# Patient Record
Sex: Female | Born: 1985 | Race: White | Hispanic: No | Marital: Single | Smoking: Current every day smoker
Health system: Southern US, Community
[De-identification: ages and names within clinical notes are randomized; demographics above are authoritative.]

---
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
## 2007-01-05 DIAGNOSIS — S92919A Unspecified fracture of unspecified toe(s), initial encounter for closed fracture: Secondary | ICD-10-CM | POA: Insufficient documentation

## 2007-05-13 IMAGING — CR DG FOOT COMPLETE 3+V*R*
3 series · 3 of 3 positions shown · non-contrast
Comparison: none

CLINICAL DATA: Right ankle swelling and discoloration.  Pain lateral foot.  Twisting injury.
 RIGHT ANKLE ? 3 VIEW:

[view not recorded (1 of 3)]
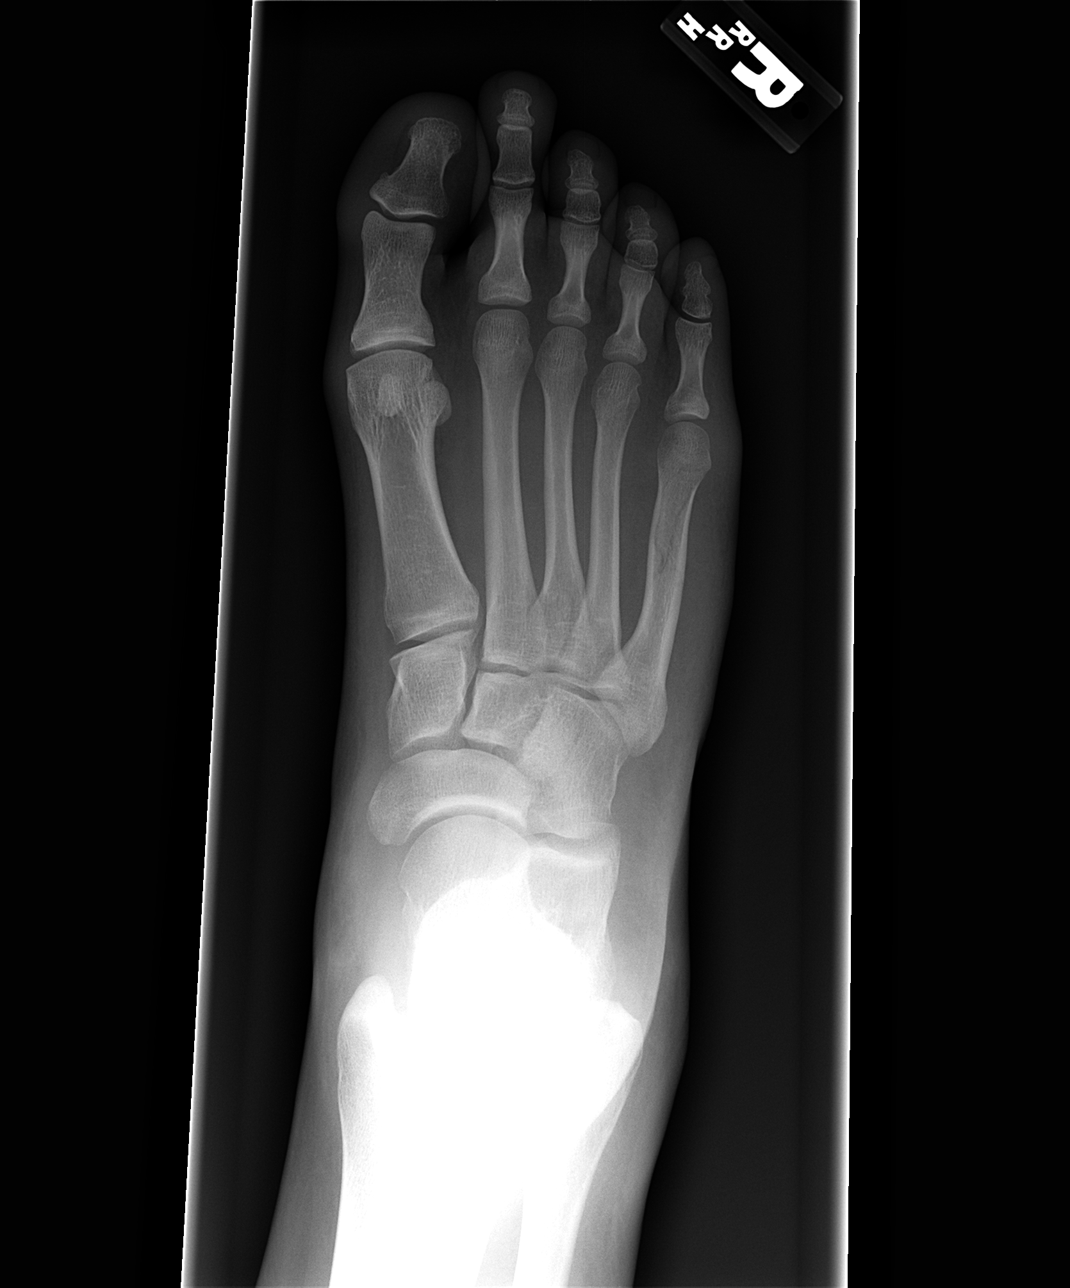

[view not recorded (2 of 3)]
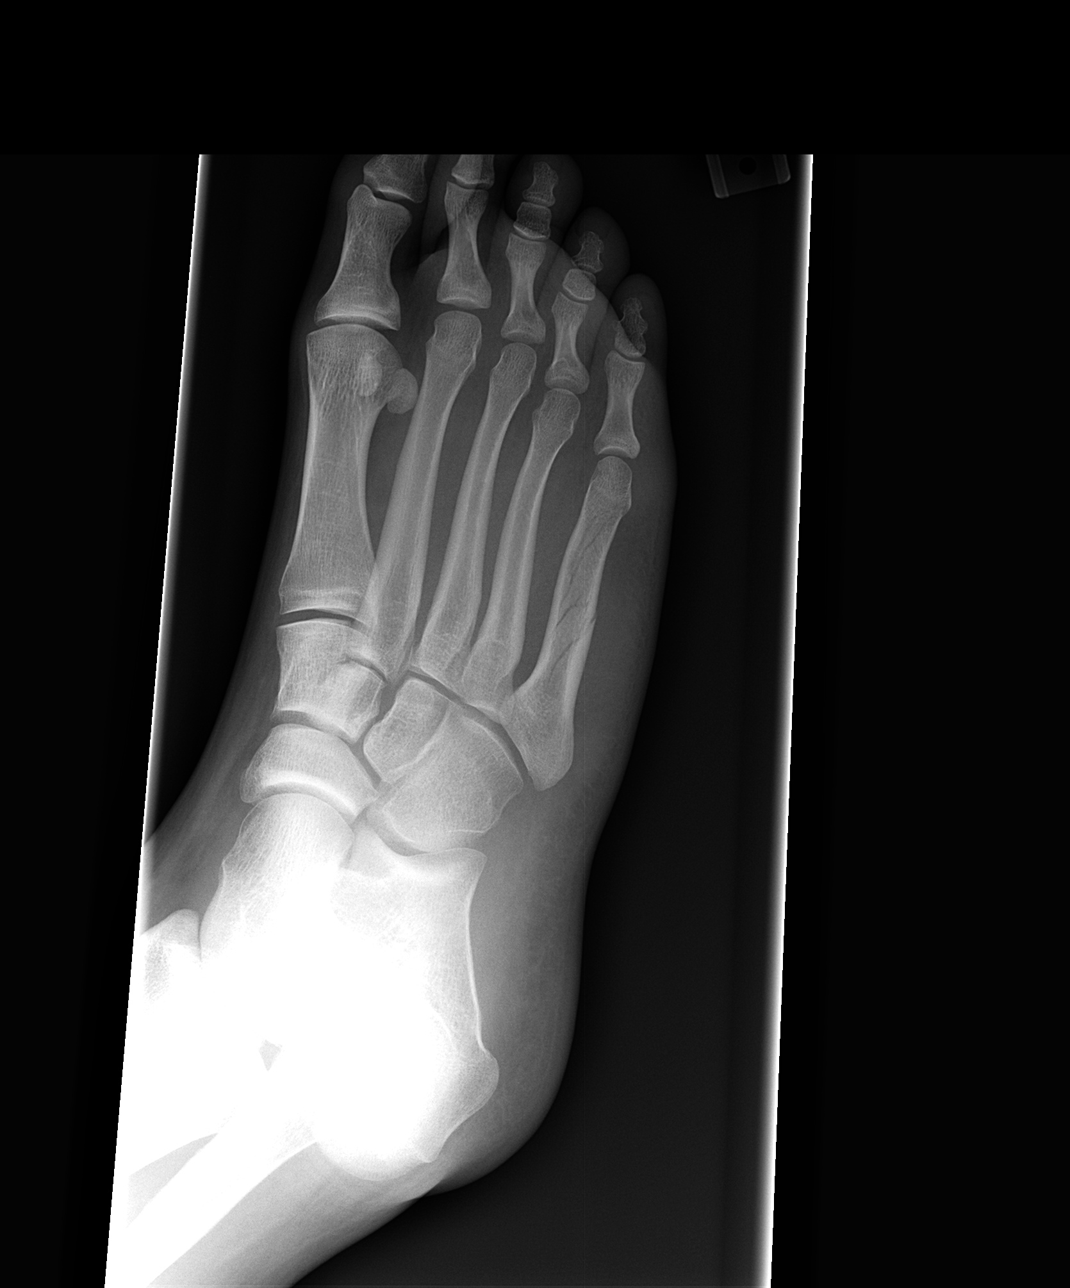

[view not recorded (3 of 3)]
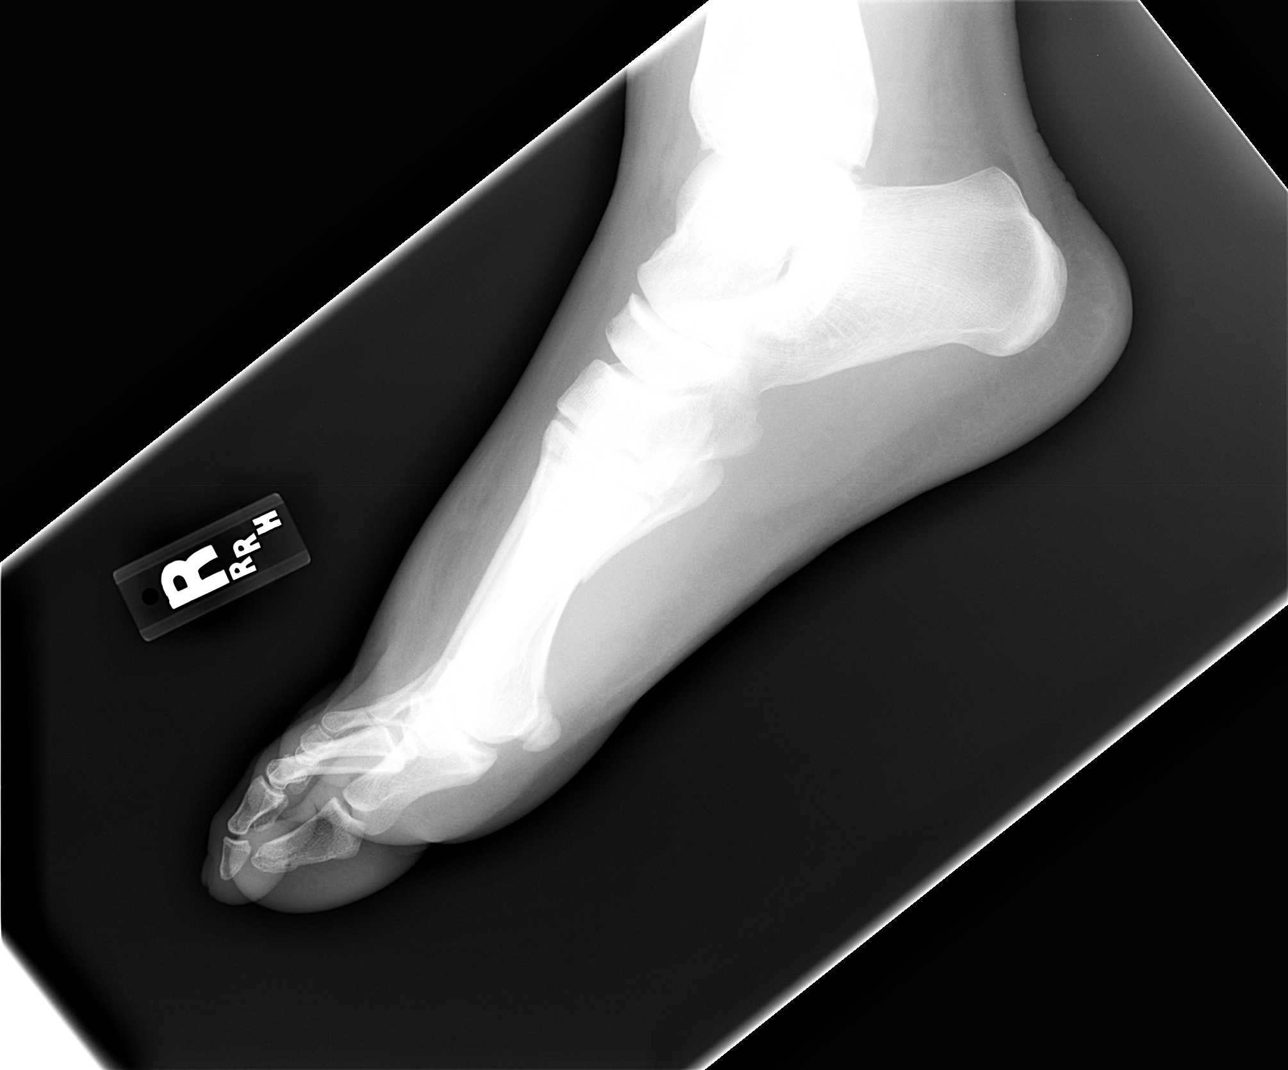

[3 of 3 positions shown; findings below may reference images not displayed]

FINDINGS: Marked soft tissue swelling particularly laterally.  No fracture or subluxation of the ankle.
IMPRESSION: No right ankle fracture or subluxation.  Acute fracture of the proximal aspect of the right 5th metatarsal is appreciated.  
 RIGHT FOOT ? 3 VIEW:
FINDINGS: Nondisplaced spiral fracture proximal mid and distal shaft of 5th metatarsal.
IMPRESSION: Acute fracture of right 5th metatarsal.

## 2007-05-13 IMAGING — CR DG ANKLE COMPLETE 3+V*R*
3 series · 3 of 3 positions shown · non-contrast
Comparison: none

CLINICAL DATA: Right ankle swelling and discoloration.  Pain lateral foot.  Twisting injury.
 RIGHT ANKLE ? 3 VIEW:

[view not recorded (1 of 3)]
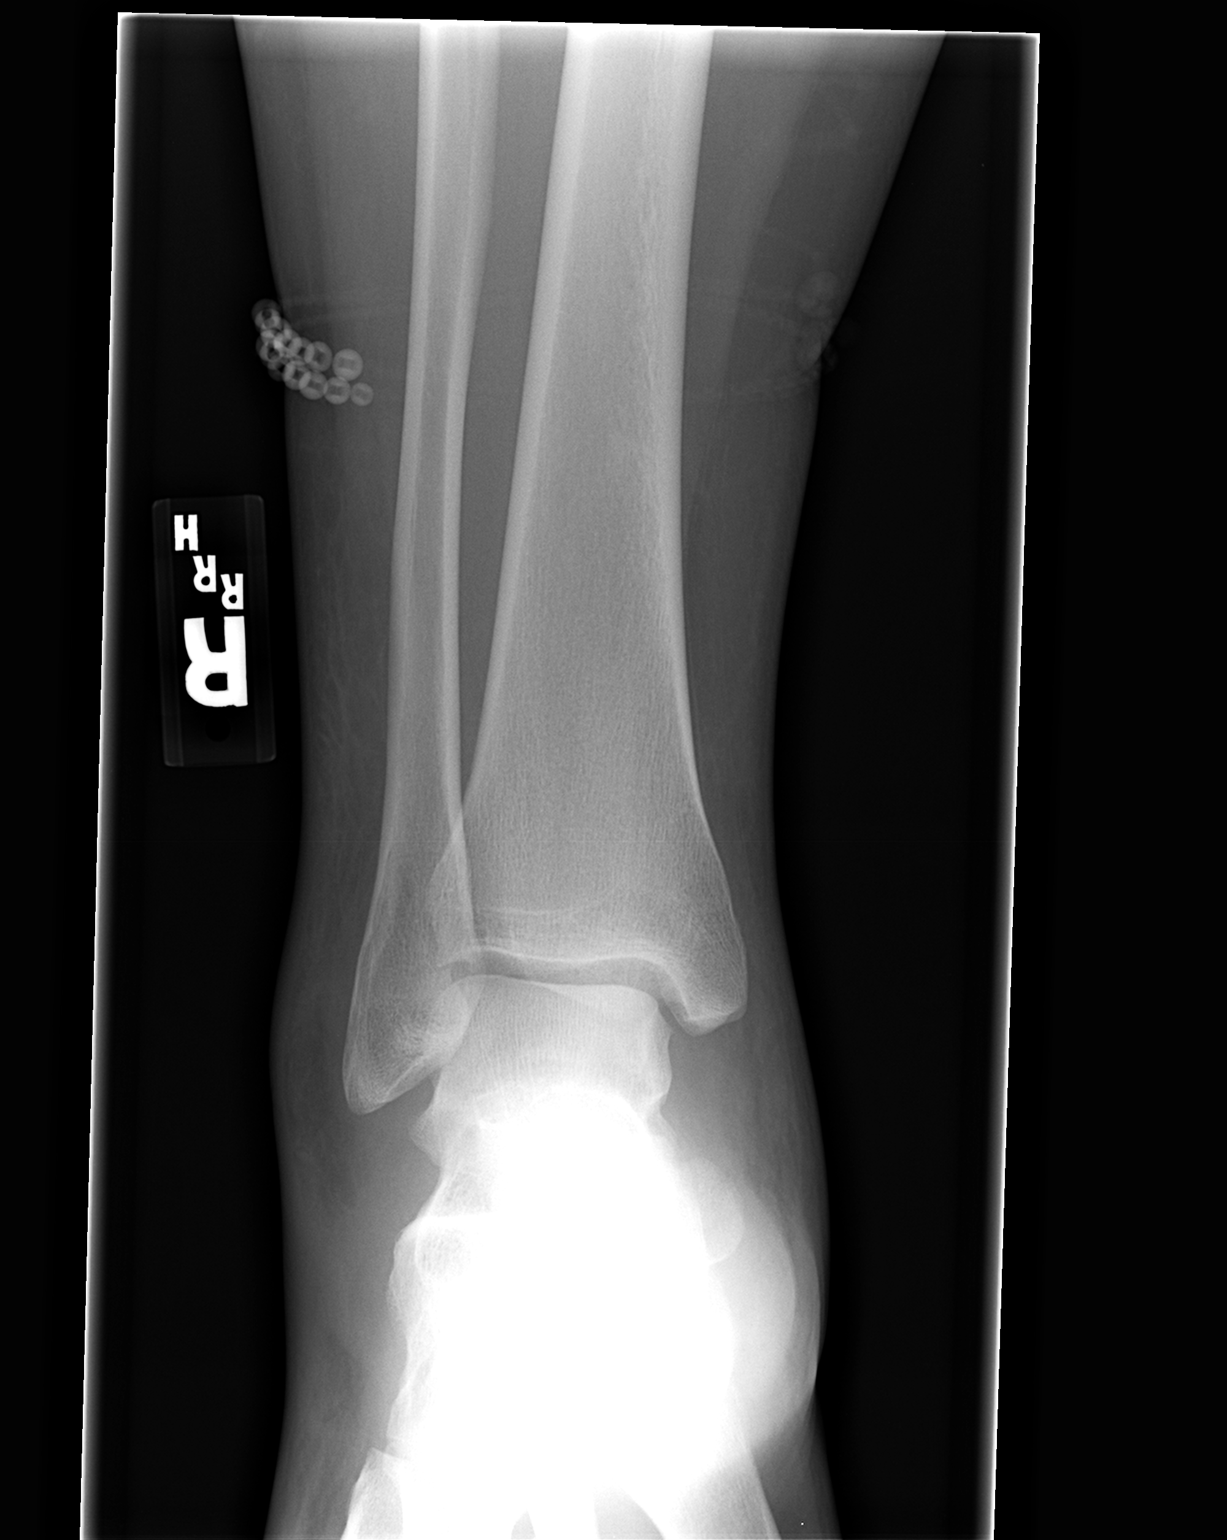

[view not recorded (2 of 3)]
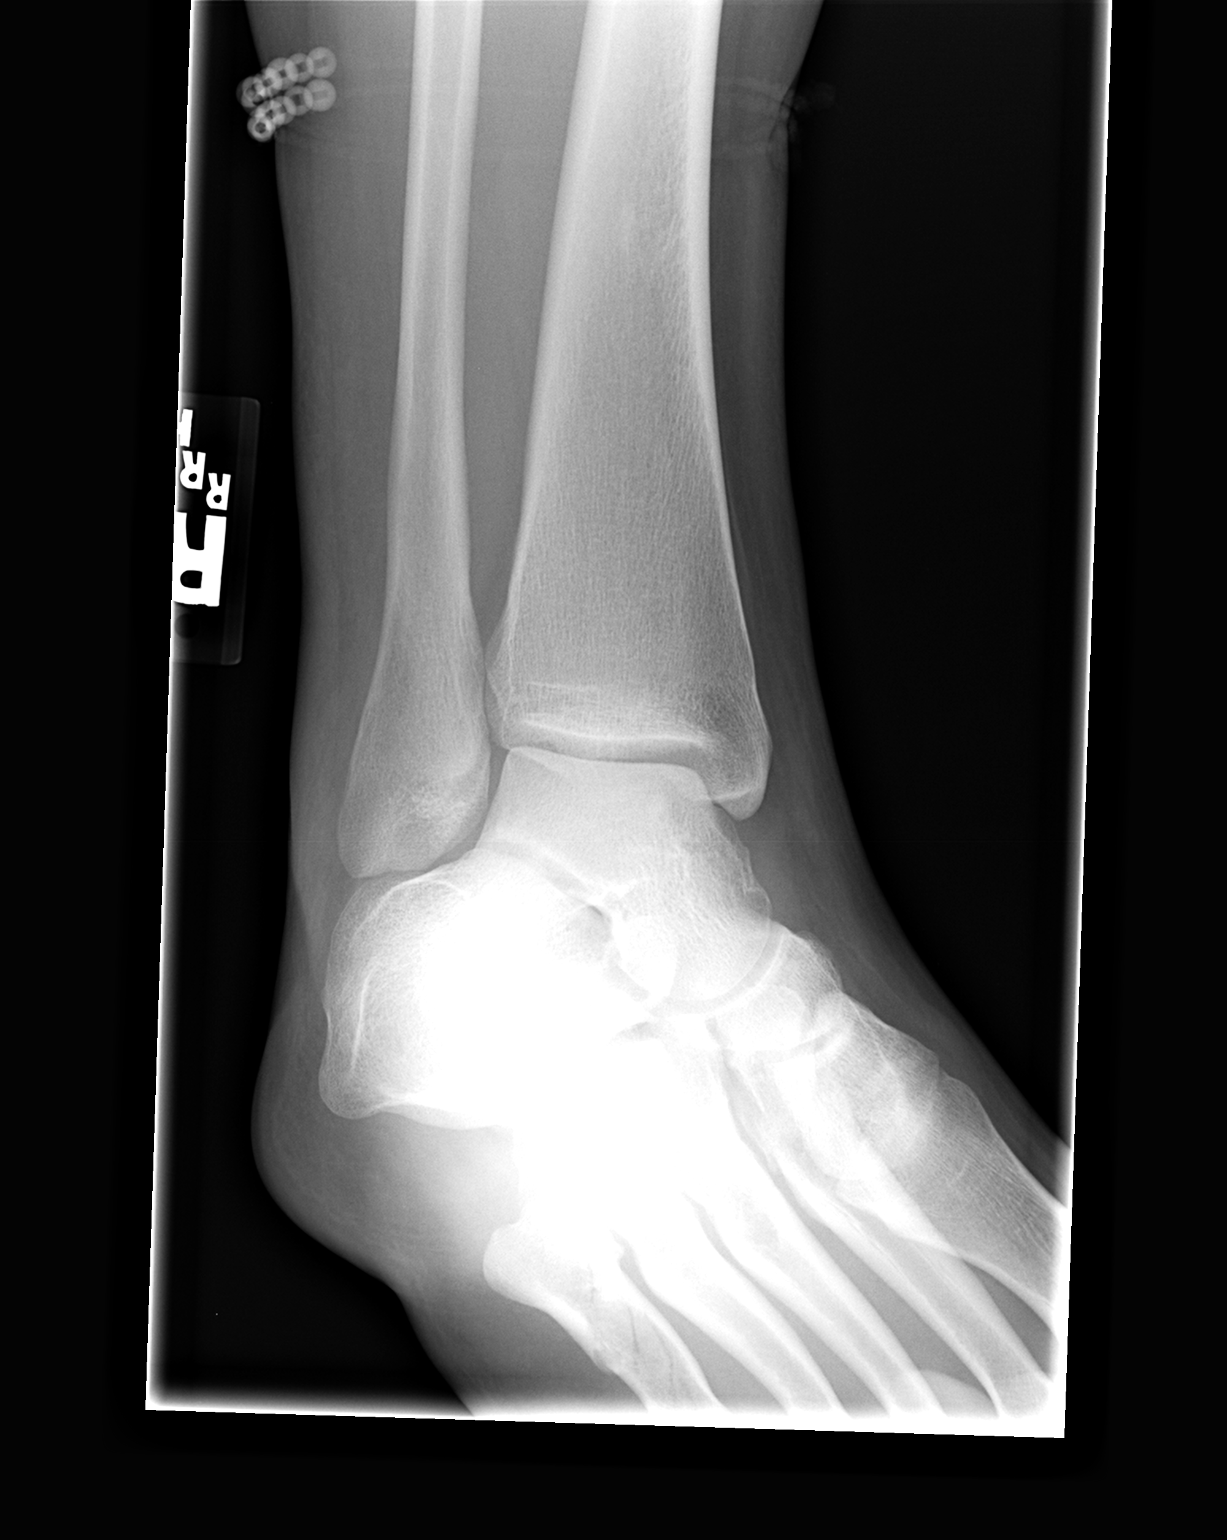

[view not recorded (3 of 3)]
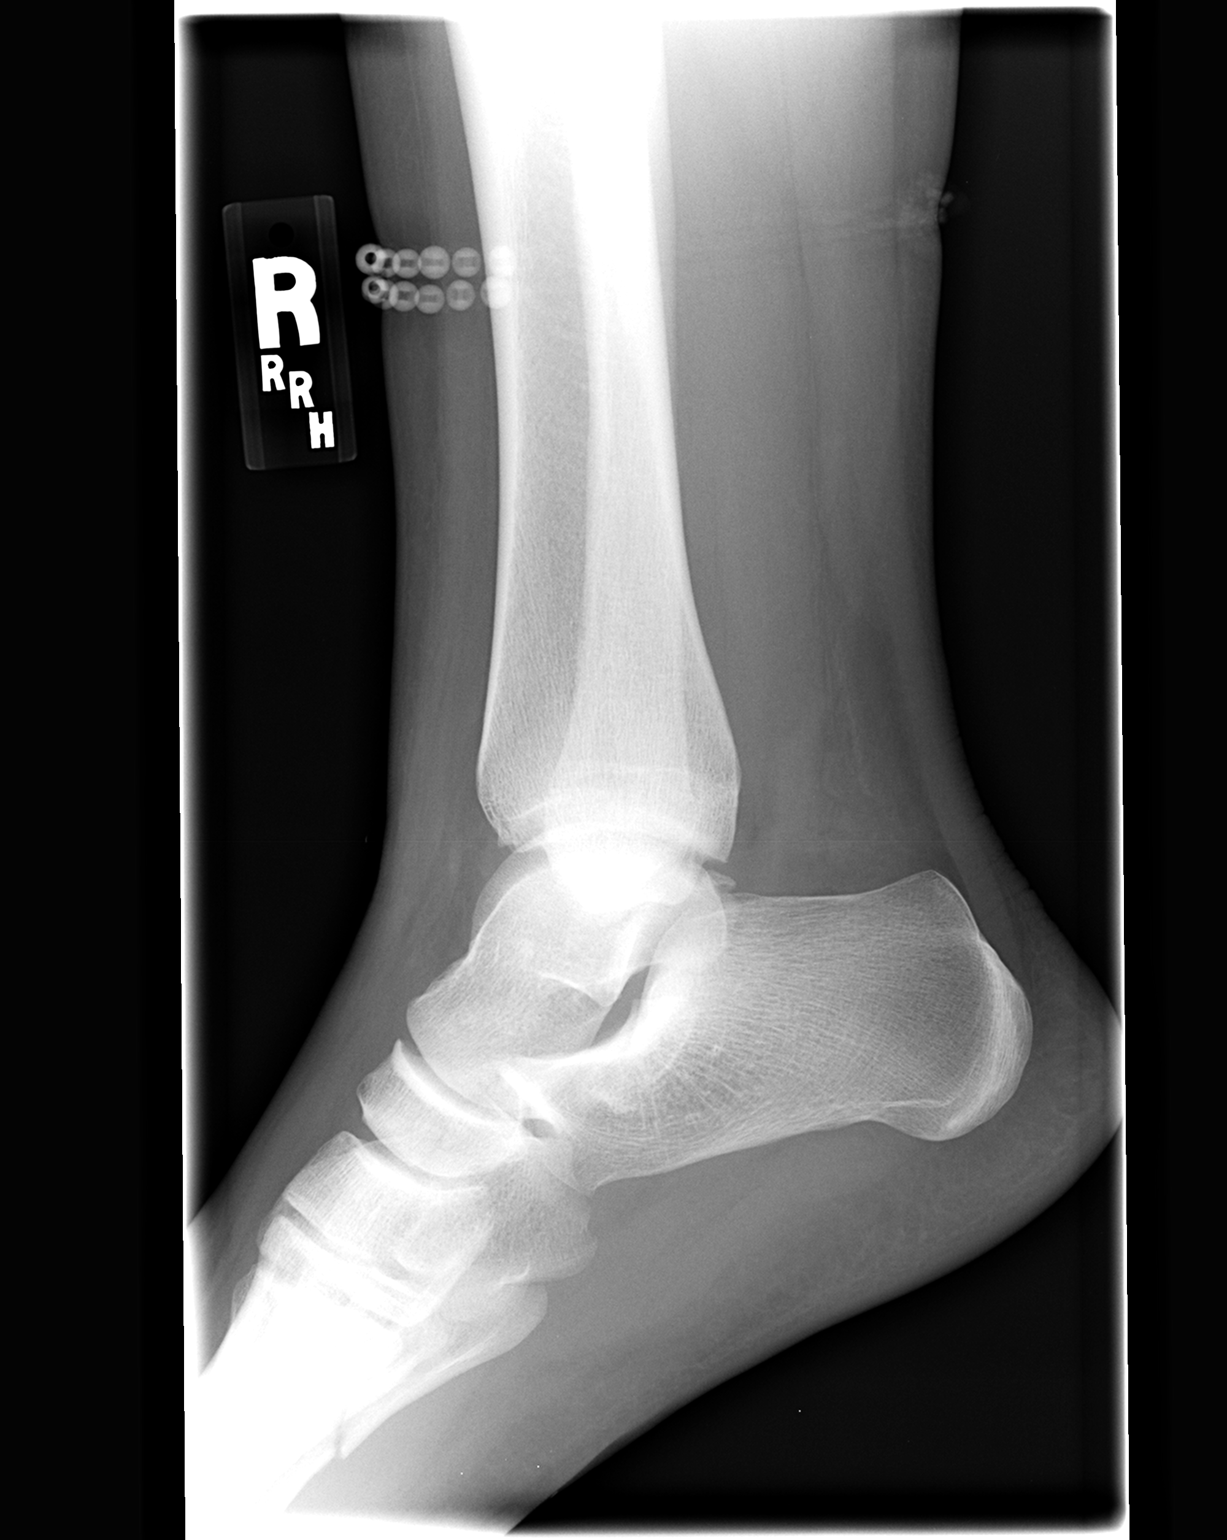

[3 of 3 positions shown; findings below may reference images not displayed]

FINDINGS: Marked soft tissue swelling particularly laterally.  No fracture or subluxation of the ankle.
IMPRESSION: No right ankle fracture or subluxation.  Acute fracture of the proximal aspect of the right 5th metatarsal is appreciated.  
 RIGHT FOOT ? 3 VIEW:
FINDINGS: Nondisplaced spiral fracture proximal mid and distal shaft of 5th metatarsal.
IMPRESSION: Acute fracture of right 5th metatarsal.

## 2015-12-03 NOTE — L&D Delivery Note (Signed)
Delivery Note At 9:46 AM a viable female was delivered via VBAC, Spontaneous (Presentation: vertex; OA) APGAR:pending  Weight 7 lb 15.7 oz (3620 g).   Placenta status: intact Cord:  3-vessel  Anesthesia:  epidural Episiotomy: none Lacerations:  1st degree perineal Suture Repair: 3.0 vicryl Est. Blood Loss (mL):  300  Mom to postpartum.  Baby to Couplet care / Skin to Skin.  Kandra NicolasJulie P Degele 08/08/2016, 10:55 AM  OB FELLOW DELIVERY ATTESTATION  I was gloved and present for the delivery in its entirety, and I agree with the above resident's note.    Jen MowElizabeth Koni Kannan, DO OB Fellow 12:32 PM

## 2016-08-08 ENCOUNTER — Encounter (HOSPITAL_COMMUNITY): Payer: Self-pay

## 2016-08-08 ENCOUNTER — Inpatient Hospital Stay (HOSPITAL_COMMUNITY): Payer: Medicaid Other | Admitting: Anesthesiology

## 2016-08-08 ENCOUNTER — Inpatient Hospital Stay (HOSPITAL_COMMUNITY)
Admission: AD | Admit: 2016-08-08 | Discharge: 2016-08-10 | DRG: 775 | Disposition: A | Discharge status: Home / Self Care | Payer: Medicaid Other | Source: Ambulatory Visit | Attending: Obstetrics and Gynecology | Admitting: Obstetrics and Gynecology

## 2016-08-08 DIAGNOSIS — IMO0001 Reserved for inherently not codable concepts without codable children: Secondary | ICD-10-CM

## 2016-08-08 DIAGNOSIS — O34211 Maternal care for low transverse scar from previous cesarean delivery: Secondary | ICD-10-CM | POA: Diagnosis present

## 2016-08-08 DIAGNOSIS — Z3A39 39 weeks gestation of pregnancy: Secondary | ICD-10-CM

## 2016-08-08 DIAGNOSIS — O99334 Smoking (tobacco) complicating childbirth: Secondary | ICD-10-CM | POA: Diagnosis present

## 2016-08-08 DIAGNOSIS — F17200 Nicotine dependence, unspecified, uncomplicated: Secondary | ICD-10-CM | POA: Diagnosis present

## 2016-08-08 DIAGNOSIS — Z3483 Encounter for supervision of other normal pregnancy, third trimester: Secondary | ICD-10-CM | POA: Diagnosis present

## 2016-08-08 LAB — TYPE AND SCREEN
ABO/RH(D): A POS
Antibody Screen: NEGATIVE

## 2016-08-08 LAB — POCT FERN TEST: POCT Fern Test: POSITIVE

## 2016-08-08 LAB — CBC
HCT: 34.5 % — ABNORMAL LOW (ref 36.0–46.0)
Hemoglobin: 11.7 g/dL — ABNORMAL LOW (ref 12.0–15.0)
MCH: 29.4 pg (ref 26.0–34.0)
MCHC: 33.9 g/dL (ref 30.0–36.0)
MCV: 86.7 fL (ref 78.0–100.0)
Platelets: 241 10*3/uL (ref 150–400)
RBC: 3.98 MIL/uL (ref 3.87–5.11)
RDW: 13.3 % (ref 11.5–15.5)
WBC: 12.3 10*3/uL — ABNORMAL HIGH (ref 4.0–10.5)

## 2016-08-08 LAB — RAPID HIV SCREEN (HIV 1/2 AB+AG)
HIV 1/2 Antibodies: NONREACTIVE
HIV-1 P24 Antigen - HIV24: NONREACTIVE

## 2016-08-08 LAB — RPR: RPR Ser Ql: NONREACTIVE

## 2016-08-08 LAB — GROUP B STREP BY PCR: Group B strep by PCR: NEGATIVE

## 2016-08-08 LAB — HEPATITIS B SURFACE ANTIGEN: HEP B S AG: NEGATIVE

## 2016-08-08 LAB — ABO/RH: ABO/RH(D): A POS

## 2016-08-08 MED ORDER — ACETAMINOPHEN 325 MG PO TABS: 650.0000 mg | ORAL_TABLET | ORAL | Status: DC | PRN

## 2016-08-08 MED ORDER — FLEET ENEMA 7-19 GM/118ML RE ENEM: 1.0000 | ENEMA | RECTAL | Status: DC | PRN

## 2016-08-08 MED ORDER — IBUPROFEN 600 MG PO TABS
600.0000 mg | ORAL_TABLET | Freq: Four times a day (QID) | ORAL | Status: DC
  Administered 2016-08-08 – 2016-08-10 (×8): 600 mg via ORAL
  Filled 2016-08-08 (×8): qty 1

## 2016-08-08 MED ORDER — DIPHENHYDRAMINE HCL 25 MG PO CAPS: 25.0000 mg | ORAL_CAPSULE | Freq: Four times a day (QID) | ORAL | Status: DC | PRN

## 2016-08-08 MED ORDER — DIPHENHYDRAMINE HCL 50 MG/ML IJ SOLN: 12.5000 mg | INTRAMUSCULAR | Status: DC | PRN

## 2016-08-08 MED ORDER — ONDANSETRON HCL 4 MG PO TABS: 4.0000 mg | ORAL_TABLET | ORAL | Status: DC | PRN

## 2016-08-08 MED ORDER — OXYCODONE-ACETAMINOPHEN 5-325 MG PO TABS: 1.0000 | ORAL_TABLET | ORAL | Status: DC | PRN

## 2016-08-08 MED ORDER — ONDANSETRON HCL 4 MG/2ML IJ SOLN: 4.0000 mg | Freq: Four times a day (QID) | INTRAMUSCULAR | Status: DC | PRN

## 2016-08-08 MED ORDER — SOD CITRATE-CITRIC ACID 500-334 MG/5ML PO SOLN
30.0000 mL | ORAL | Status: DC | PRN
  Filled 2016-08-08: qty 15

## 2016-08-08 MED ORDER — BENZOCAINE-MENTHOL 20-0.5 % EX AERO: 1.0000 "application " | INHALATION_SPRAY | CUTANEOUS | Status: DC | PRN

## 2016-08-08 MED ORDER — LACTATED RINGERS IV SOLN: 500.0000 mL | Freq: Once | INTRAVENOUS | Status: DC

## 2016-08-08 MED ORDER — TETANUS-DIPHTH-ACELL PERTUSSIS 5-2.5-18.5 LF-MCG/0.5 IM SUSP: 0.5000 mL | Freq: Once | INTRAMUSCULAR | Status: DC

## 2016-08-08 MED ORDER — FENTANYL 2.5 MCG/ML BUPIVACAINE 1/10 % EPIDURAL INFUSION (WH - ANES)
14.0000 mL/h | INTRAMUSCULAR | Status: DC | PRN
  Filled 2016-08-08: qty 125

## 2016-08-08 MED ORDER — LACTATED RINGERS IV SOLN
INTRAVENOUS | Status: DC
  Administered 2016-08-08: 125 mL/h via INTRAVENOUS

## 2016-08-08 MED ORDER — PENICILLIN G POTASSIUM 5000000 UNITS IJ SOLR
5.0000 10*6.[IU] | Freq: Once | INTRAVENOUS | Status: DC
  Administered 2016-08-08: 5 10*6.[IU] via INTRAVENOUS
  Filled 2016-08-08: qty 5

## 2016-08-08 MED ORDER — MISOPROSTOL 200 MCG PO TABS
1000.0000 ug | ORAL_TABLET | Freq: Once | ORAL | Status: AC
  Administered 2016-08-08: 1000 ug via RECTAL
  Filled 2016-08-08: qty 5

## 2016-08-08 MED ORDER — FENTANYL CITRATE (PF) 100 MCG/2ML IJ SOLN
100.0000 ug | INTRAMUSCULAR | Status: DC | PRN
  Administered 2016-08-08: 100 ug via INTRAVENOUS
  Filled 2016-08-08: qty 2

## 2016-08-08 MED ORDER — EPHEDRINE 5 MG/ML INJ
10.0000 mg | INTRAVENOUS | Status: DC | PRN
  Filled 2016-08-08: qty 4

## 2016-08-08 MED ORDER — PHENYLEPHRINE 40 MCG/ML (10ML) SYRINGE FOR IV PUSH (FOR BLOOD PRESSURE SUPPORT)
80.0000 ug | PREFILLED_SYRINGE | INTRAVENOUS | Status: DC | PRN
  Filled 2016-08-08: qty 5

## 2016-08-08 MED ORDER — COCONUT OIL OIL: 1.0000 "application " | TOPICAL_OIL | Status: DC | PRN

## 2016-08-08 MED ORDER — DIBUCAINE 1 % RE OINT: 1.0000 "application " | TOPICAL_OINTMENT | RECTAL | Status: DC | PRN

## 2016-08-08 MED ORDER — PENICILLIN G POTASSIUM 5000000 UNITS IJ SOLR
2.5000 10*6.[IU] | INTRAVENOUS | Status: DC
  Filled 2016-08-08: qty 2.5

## 2016-08-08 MED ORDER — LIDOCAINE HCL (PF) 1 % IJ SOLN
30.0000 mL | INTRAMUSCULAR | Status: AC | PRN
  Administered 2016-08-08: 30 mL via SUBCUTANEOUS
  Filled 2016-08-08: qty 30

## 2016-08-08 MED ORDER — WITCH HAZEL-GLYCERIN EX PADS: 1.0000 "application " | MEDICATED_PAD | CUTANEOUS | Status: DC | PRN

## 2016-08-08 MED ORDER — PHENYLEPHRINE 40 MCG/ML (10ML) SYRINGE FOR IV PUSH (FOR BLOOD PRESSURE SUPPORT)
80.0000 ug | PREFILLED_SYRINGE | INTRAVENOUS | Status: DC | PRN
  Filled 2016-08-08: qty 10
  Filled 2016-08-08: qty 5

## 2016-08-08 MED ORDER — ZOLPIDEM TARTRATE 5 MG PO TABS: 5.0000 mg | ORAL_TABLET | Freq: Every evening | ORAL | Status: DC | PRN

## 2016-08-08 MED ORDER — SIMETHICONE 80 MG PO CHEW: 80.0000 mg | CHEWABLE_TABLET | ORAL | Status: DC | PRN

## 2016-08-08 MED ORDER — OXYTOCIN 40 UNITS IN LACTATED RINGERS INFUSION - SIMPLE MED
2.5000 [IU]/h | INTRAVENOUS | Status: DC
  Filled 2016-08-08: qty 1000

## 2016-08-08 MED ORDER — OXYCODONE-ACETAMINOPHEN 5-325 MG PO TABS: 2.0000 | ORAL_TABLET | ORAL | Status: DC | PRN

## 2016-08-08 MED ORDER — OXYTOCIN BOLUS FROM INFUSION
500.0000 mL | Freq: Once | INTRAVENOUS | Status: AC
  Administered 2016-08-08: 500 mL via INTRAVENOUS

## 2016-08-08 MED ORDER — LACTATED RINGERS IV SOLN: 500.0000 mL | INTRAVENOUS | Status: DC | PRN

## 2016-08-08 MED ORDER — SENNOSIDES-DOCUSATE SODIUM 8.6-50 MG PO TABS
2.0000 | ORAL_TABLET | ORAL | Status: DC
  Administered 2016-08-09 (×2): 2 via ORAL
  Filled 2016-08-08 (×2): qty 2

## 2016-08-08 MED ORDER — ONDANSETRON HCL 4 MG/2ML IJ SOLN: 4.0000 mg | INTRAMUSCULAR | Status: DC | PRN

## 2016-08-08 MED ORDER — LIDOCAINE HCL (PF) 1 % IJ SOLN
INTRAMUSCULAR | Status: DC | PRN
Start: 2016-08-08 — End: 2016-08-11
  Administered 2016-08-08 (×2): 7 mL via EPIDURAL

## 2016-08-08 MED ORDER — PRENATAL MULTIVITAMIN CH
1.0000 | ORAL_TABLET | Freq: Every day | ORAL | Status: DC
  Administered 2016-08-08 – 2016-08-10 (×2): 1 via ORAL
  Filled 2016-08-08 (×2): qty 1

## 2016-08-08 NOTE — Anesthesia Procedure Notes (Signed)
Epidural Patient location during procedure: OB Start time: 08/08/2016 9:33 AM End time: 08/08/2016 9:37 AM  Staffing Anesthesiologist: Leilani AbleHATCHETT, Argel Pablo Performed: anesthesiologist   Preanesthetic Checklist Completed: patient identified, surgical consent, pre-op evaluation, timeout performed, IV checked, risks and benefits discussed and monitors and equipment checked  Epidural Patient position: sitting Prep: site prepped and draped and DuraPrep Patient monitoring: continuous pulse ox and blood pressure Approach: midline Location: L3-L4 Injection technique: LOR air  Needle:  Needle type: Tuohy  Needle gauge: 17 G Needle length: 9 cm and 9 Needle insertion depth: 7 cm Catheter type: closed end flexible Catheter size: 19 Gauge Catheter at skin depth: 12 cm Test dose: negative and Other  Assessment Sensory level: T10 Events: blood not aspirated, injection not painful, no injection resistance, negative IV test and no paresthesia  Additional Notes Reason for block:procedure for pain

## 2016-08-08 NOTE — H&P (Signed)
LABOR AND DELIVERY ADMISSION HISTORY AND PHYSICAL NOTE  Jeanne Edwards is a 30 y.o. female G2P1001 with IUP at 7361w2d by LMP and 2nd trimester U/S per pt presenting for SOL.  Felt LOF and had contractions starting around 4:30am She reports positive fetal movement. She denies vaginal bleeding.  Prenatal History/Complications: desires TOLAC  Past Medical History: History reviewed. No pertinent past medical history. None per pt  Past Surgical History: Past Surgical History:  Procedure Laterality Date  . CESAREAN SECTION      Obstetrical History: OB History    Gravida Para Term Preterm AB Living   2 1 1     1    SAB TAB Ectopic Multiple Live Births           1      Social History: Social History   Social History  . Marital status: Single    Spouse name: N/A  . Number of children: N/A  . Years of education: N/A   Social History Main Topics  . Smoking status: Current Every Day Smoker  . Smokeless tobacco: None  . Alcohol use No  . Drug use: No  . Sexual activity: Yes    Birth control/ protection: None   Other Topics Concern  . None   Social History Narrative  . None    Family History: History reviewed. No pertinent family history.  Allergies: Allergies  Allergen Reactions  . Gentamycin [Gentamicin] Rash    No prescriptions prior to admission.     Review of Systems   All systems reviewed and negative except as stated in HPI  Blood pressure 131/87, pulse 97, SpO2 97 %. General appearance: alert, cooperative and no distress Lungs: no respiratory distress Heart: regular rate Abdomen: soft, non-tender Extremities: No calf swelling or tenderness Presentation: cephalic by cervical exam by RN in MAU Fetal monitoring: 125 baseline, moderate variability, +accelerations, no decelerations Uterine activity: q3-654min Dilation: 3 Effacement (%): 70 Station: -2 Exam by:: Lysle Dingwall. Simpson RN   [facility], Rh:   Antibody:   Rubella:   RPR:    HBsAg:    HIV:    GBS:   unknown   Prenatal Transfer Tool  Maternal Diabetes: No Genetic Screening: Declined Maternal Ultrasounds/Referrals: Normal Fetal Ultrasounds or other Referrals:  None Maternal Substance Abuse:  Yes:  Type: Smoker, Marijuana Significant Maternal Medications:  None Significant Maternal Lab Results: Lab values include: Other: GBS unknown  Results for orders placed or performed during the hospital encounter of 08/08/16 (from the past 24 hour(s))  [facility]   Collection Time: 08/08/16  6:14 AM  Result Value Ref Range   POCT Fern Test Positive = ruptured amniotic membanes     Patient Active Problem List   Diagnosis Date Noted  . FX CLOSED PHALANX, FOOT 01/05/2007    Assessment: Jeanne Edwards is a 30 y.o. G2P1001 at 1361w2d here for SOL, desires TOLAC  #Labor:expectant management  #Pain: Planning on epidural during active labor #FWB: Category I #ID:  GBS unknown #MOF: bottle #MOC: desires BTL, willing to do patch or depo in interval #Circ:  Girl, N/A.  Leland HerElsia J Yoo, DO PGY-1 9/7/20176:20 AM   OB FELLOW HISTORY AND PHYSICAL ATTESTATION  I have seen and examined this patient; I agree with above documentation in the resident's note.    Jen MowElizabeth Evyn Kooyman, DO OB Fellow 08/08/2016, 7:25 PM

## 2016-08-08 NOTE — Anesthesia Postprocedure Evaluation (Signed)
Anesthesia Post Note  Patient: Jeanne Edwards  Procedure(s) Performed: * No procedures listed *  Patient location during evaluation: Mother Baby Anesthesia Type: Spinal Level of consciousness: awake and alert and oriented Pain management: satisfactory to patient Vital Signs Assessment: post-procedure vital signs reviewed and stable Respiratory status: respiratory function stable and spontaneous breathing Cardiovascular status: blood pressure returned to baseline Postop Assessment: no headache, no backache, spinal receding, patient able to bend at knees and adequate PO intake Anesthetic complications: no     Last Vitals:  Vitals:   08/08/16 1135 08/08/16 1235  BP: (!) 142/85 140/88  Pulse: 89 93  Resp:  18  Temp: 36.9 C 36.4 C    Last Pain:  Vitals:   08/08/16 1235  TempSrc: Oral  PainSc: 5    Pain Goal: Patients Stated Pain Goal: 2 (08/08/16 1235)               Tavio Biegel

## 2016-08-08 NOTE — Progress Notes (Signed)
Notified of pt arrival in MAU, SROM with positive fern and vaginal exam. Reviewed pregnancy history and c/s with first pregnancy. Will come talk to pt about TOLAC

## 2016-08-08 NOTE — Anesthesia Preprocedure Evaluation (Signed)
Anesthesia Evaluation  Patient identified by MRN, date of birth, ID band Patient awake    Reviewed: Allergy & Precautions, H&P , NPO status , Patient's Chart, lab work & pertinent test results  Airway Mallampati: I  TM Distance: >3 FB Neck ROM: full    Dental no notable dental hx.    Pulmonary neg pulmonary ROS, Current Smoker,    Pulmonary exam normal        Cardiovascular negative cardio ROS Normal cardiovascular exam     Neuro/Psych negative neurological ROS  negative psych ROS   GI/Hepatic negative GI ROS, Neg liver ROS,   Endo/Other  negative endocrine ROS  Renal/GU negative Renal ROS     Musculoskeletal   Abdominal Normal abdominal exam  (+)   Peds  Hematology negative hematology ROS (+)   Anesthesia Other Findings   Reproductive/Obstetrics (+) Pregnancy                             Anesthesia Physical Anesthesia Plan  ASA: II  Anesthesia Plan: Epidural   Post-op Pain Management:    Induction:   Airway Management Planned:   Additional Equipment:   Intra-op Plan:   Post-operative Plan:   Informed Consent: I have reviewed the patients History and Physical, chart, labs and discussed the procedure including the risks, benefits and alternatives for the proposed anesthesia with the patient or authorized representative who has indicated his/her understanding and acceptance.     Plan Discussed with:   Anesthesia Plan Comments:         Anesthesia Quick Evaluation  

## 2016-08-08 NOTE — MAU Note (Signed)
Pt presents complaining of SROM at 0430 with copious amounts of clear fluid. States she is contracting every 10 minutes. Denies bleeding. Reports good fetal movement. States [place] until the end of [place]. No visits since then. No care in [place]

## 2016-08-08 NOTE — Anesthesia Postprocedure Evaluation (Signed)
Anesthesia Post Note  Patient: Jeanne Edwards  Procedure(s) Performed: * No procedures listed *  Patient location during evaluation: Mother Baby Anesthesia Type: Epidural Level of consciousness: awake and alert Pain management: satisfactory to patient Vital Signs Assessment: post-procedure vital signs reviewed and stable Respiratory status: respiratory function stable Cardiovascular status: stable Postop Assessment: no headache, no backache, epidural receding, patient able to bend at knees, no signs of nausea or vomiting and adequate PO intake Anesthetic complications: no     Last Vitals:  Vitals:   08/08/16 1135 08/08/16 1235  BP: (!) 142/85 140/88  Pulse: 89 93  Resp:  18  Temp: 36.9 C 36.4 C    Last Pain:  Vitals:   08/08/16 1235  TempSrc: Oral  PainSc: 5    Pain Goal: Patients Stated Pain Goal: 2 (08/08/16 1235)               [name]

## 2016-08-09 LAB — RUBELLA SCREEN: RUBELLA: 1.58 {index} (ref >0.99)

## 2016-08-09 NOTE — Progress Notes (Signed)
POSTPARTUM PROGRESS NOTE  Post Partum Day 1  Subjective:  Jeanne Edwards is a 30 y.o. [id number] 5253w2d s/p NSVD.  No acute events overnight.  Pt denies problems with ambulating, voiding or po intake.  She denies nausea or vomiting.  Pain is well controlled.  Lochia Small.   Objective: Blood pressure 120/78, pulse 74, temperature 98.2 F (36.8 C), temperature source Oral, resp. rate 18, height 5\' 7"  (1.702 m), weight 200 lb (90.7 kg), SpO2 96 %, unknown if currently breastfeeding.  Physical Exam:  General: alert, cooperative and no distress Lochia:normal flow Chest: no respiratory distress Heart:regular rate, distal pulses intact Abdomen: soft, nontender,  Uterine Fundus: firm, appropriately tender DVT Evaluation: No calf swelling or tenderness Extremities: no edema  Recent Labs  08/08/16 0620  HGB 11.7*  HCT 34.5*    Assessment/Plan:  Jeanne Edwards is a 30 y.o. [id number] 2253w2d s/p SVD/VBAC. Now PPD#1. Doing well.  - Routine postpartum care - D/c home tomorrow.    LOS: 1 day   Kandra NicolasJulie P DegeleMD 08/09/2016, 10:55 AM   OB FELLOW POSTPARTUM PROGRESS NOTE ATTESTATION  I have seen and examined this patient and agree with above documentation in the resident's note.   Ernestina PennaNicholas Schenk, MD 7:36 PM

## 2016-08-09 NOTE — Progress Notes (Signed)
Pt pulled out new IV site. Pt tolerating po Urine output adequate. Encouraged pt to drink plenty of fluids. Pt verbalized understanding.

## 2016-08-10 MED ORDER — IBUPROFEN 600 MG PO TABS: 600.0000 mg | ORAL_TABLET | Freq: Four times a day (QID) | ORAL | 0 refills | Status: AC

## 2016-08-10 MED ORDER — ACETAMINOPHEN 325 MG PO TABS: 650.0000 mg | ORAL_TABLET | ORAL | 0 refills | Status: AC | PRN

## 2016-08-10 NOTE — Clinical Social Work Maternal (Signed)
  CLINICAL SOCIAL WORK MATERNAL/CHILD NOTE  Patient Details  Name: Jeanne Edwards MRN: [id number] Date of Birth: 08/27/1986  Date:  08/10/2016  Clinical Social Worker Initiating Note:  Ferdinand Lango Elianis Fischbach, MSW, LCSW-A Date/ Time Initiated:  08/10/16/1141     Child's Name:  Jeanne Edwards    Legal Guardian:  Mother   Need for Interpreter:  None   Date of Referral:  08/09/16     Reason for Referral:  Current Substance Use/Substance Use During Pregnancy    Referral Source:  Physician   Address:  [address] [place], [place] [address]  Phone number:  [phone]   Household Members:  Self, Minor Children, Significant Other   Natural Supports (not living in the home):  Immediate Family, Spouse/significant other   Professional Supports:     Employment:     Type of Work:     Education:      Pensions consultant:  [employer]   Other Resources:  [employer], Physicist, medical    Cultural/Religious Considerations Which May Impact Care:  None reported   Strengths:  Ability to meet basic needs , Home prepared for child , Compliance with medical plan    Risk Factors/Current Problems:      Cognitive State:  Alert    Mood/Affect:  Calm    CSW Assessment: CSW met with MOB at bedside. At this time writer explained reasoning for visit being due to baby being born positive for THC. This Probation officer explained and dicussed hospitals policy and procedure. MOB verbalized understanding. This Probation officer made a report to [employer] weekend worker [phone]. CSW will continue to follow pending DSS report made.   CSW Plan/Description:  Child Protective Service Report    Water quality scientist, MSW, LCSW-A Clinical Social Worker  [facility]  Office: [phone]

## 2016-08-10 NOTE — Progress Notes (Signed)
CSW [place] [facility] who stated she has assessed the family and the home environment. Jan notes it is ok for baby to d/c home with family. No other needs addressed or requested at this time. Case closed to CSW.   Hadeel Hillebrand, MSW, LCSW-A Clinical Social Worker  [facility]  Office: [phone]

## 2016-08-10 NOTE — Discharge Summary (Signed)
OB Discharge Summary     Patient Name: Jeanne Edwards DOB: 1986/01/03 MRN: [id number]  Date of admission: 08/08/2016 Delivering MD: Michaele Offer   Date of discharge: 08/10/2016  Admitting diagnosis: DUE DATE SEPT 12 WATER BROKE Intrauterine pregnancy: [redacted]w[redacted]d     Secondary diagnosis:  Active Problems:   Active labor  Additional problems: none     Discharge diagnosis: Term Pregnancy Delivered                                                                                                Post partum procedures:none  Augmentation: none  Complications: None  Hospital course:  Onset of Labor With Vaginal Delivery     30 y.o. yo G2P2002 at [redacted]w[redacted]d was admitted in Active Labor on 08/08/2016. Patient had an uncomplicated labor course as follows:  Membrane Rupture Time/Date: 4:30 AM ,08/08/2016   Intrapartum Procedures: Episiotomy: None [1]                                         Lacerations:  1st degree [2];Perineal [11]  Patient had a delivery of a Viable infant. 08/08/2016  Information for the patient's newborn:  Jeanne, Narayan Girl Edwards [[id number]]  Delivery Method: Vag-Spont    Pateint had an uncomplicated postpartum course.  She is ambulating, tolerating a regular diet, passing flatus, and urinating well. Patient is discharged home in stable condition on 08/10/16.    Physical exam Vitals:   08/09/16 0016 08/09/16 0607 08/09/16 1821 08/10/16 0645  BP: 118/62 120/78 131/67 103/63  Pulse: 95 74 80 75  Resp:  18 18 18   Temp: 97.9 F (36.6 C) 98.2 F (36.8 C)  97.9 F (36.6 C)  TempSrc: Oral Oral    SpO2: 96%     Weight:      Height:       General: alert, cooperative and no distress Lochia: appropriate Uterine Fundus: firm Incision: N/A DVT Evaluation: No evidence of DVT seen on physical exam. No significant calf/ankle edema. Labs: Lab Results  Component Value Date   WBC 12.3 (H) 08/08/2016   HGB 11.7 (L) 08/08/2016   HCT 34.5 (L) 08/08/2016   MCV 86.7 08/08/2016    PLT 241 08/08/2016   No flowsheet data found.  Discharge instruction: per After Visit Summary and "Baby and Me Booklet".  After visit meds:    Medication List    TAKE these medications   acetaminophen 325 MG tablet Commonly known as:  TYLENOL Take 2 tablets (650 mg total) by mouth every 4 (four) hours as needed (for pain scale < 4).   ibuprofen 600 MG tablet Commonly known as:  ADVIL,MOTRIN Take 1 tablet (600 mg total) by mouth every 6 (six) hours. What changed:  medication strength  how much to take  when to take this  reasons to take this       Diet: routine diet  Activity: Advance as tolerated. Pelvic rest for 6 weeks.   Outpatient follow up:2 weeks Follow up Appt:No future  appointments. Follow up Visit:No Follow-up on file.  Postpartum contraception: Tubal Ligation  Newborn Data: Live born female  Birth Weight: 7 lb 15.7 oz (3620 g) APGAR: 9, 9  Baby Feeding: Bottle Disposition:home with mother   08/10/2016 Jeanne PennaNicholas Adarsh Mundorf, MD

## 2016-08-10 NOTE — Discharge Instructions (Signed)
